# Patient Record
Sex: Female | Born: 1940 | Race: White | Hispanic: No | Marital: Married | Smoking: Never smoker
Health system: Southern US, Community
[De-identification: ages and names within clinical notes are randomized; demographics above are authoritative.]

## PROBLEM LIST (undated history)

## (undated) DIAGNOSIS — Z9889 Other specified postprocedural states: Secondary | ICD-10-CM

## (undated) DIAGNOSIS — E119 Type 2 diabetes mellitus without complications: Secondary | ICD-10-CM

## (undated) DIAGNOSIS — D649 Anemia, unspecified: Secondary | ICD-10-CM

## (undated) DIAGNOSIS — I10 Essential (primary) hypertension: Secondary | ICD-10-CM

## (undated) DIAGNOSIS — K589 Irritable bowel syndrome without diarrhea: Secondary | ICD-10-CM

## (undated) DIAGNOSIS — E785 Hyperlipidemia, unspecified: Secondary | ICD-10-CM

## (undated) DIAGNOSIS — M1990 Unspecified osteoarthritis, unspecified site: Secondary | ICD-10-CM

## (undated) DIAGNOSIS — F32A Depression, unspecified: Secondary | ICD-10-CM

## (undated) DIAGNOSIS — F329 Major depressive disorder, single episode, unspecified: Secondary | ICD-10-CM

## (undated) DIAGNOSIS — K279 Peptic ulcer, site unspecified, unspecified as acute or chronic, without hemorrhage or perforation: Secondary | ICD-10-CM

## (undated) HISTORY — PX: COLONOSCOPY W/ POLYPECTOMY: SHX1380

## (undated) HISTORY — PX: BREAST BIOPSY: SHX20

## (undated) HISTORY — PX: BREAST CYST ASPIRATION: SHX578

---

## 2004-08-15 ENCOUNTER — Ambulatory Visit: Payer: Self-pay | Admitting: Family Medicine

## 2005-08-14 ENCOUNTER — Ambulatory Visit: Payer: Self-pay | Admitting: Family Medicine

## 2005-10-02 ENCOUNTER — Inpatient Hospital Stay: Payer: Self-pay | Admitting: Surgery

## 2005-10-02 ENCOUNTER — Other Ambulatory Visit: Payer: Self-pay

## 2006-07-30 ENCOUNTER — Ambulatory Visit: Payer: Self-pay | Admitting: Rheumatology

## 2006-08-19 ENCOUNTER — Ambulatory Visit: Payer: Self-pay

## 2007-08-25 ENCOUNTER — Ambulatory Visit: Payer: Self-pay

## 2008-08-31 ENCOUNTER — Ambulatory Visit: Payer: Self-pay

## 2009-09-01 ENCOUNTER — Ambulatory Visit: Payer: Self-pay | Admitting: Internal Medicine

## 2010-08-28 ENCOUNTER — Ambulatory Visit: Payer: Self-pay | Admitting: Unknown Physician Specialty

## 2010-09-03 ENCOUNTER — Ambulatory Visit: Payer: Self-pay | Admitting: Internal Medicine

## 2011-09-26 ENCOUNTER — Ambulatory Visit: Payer: Self-pay | Admitting: Internal Medicine

## 2011-10-31 ENCOUNTER — Ambulatory Visit: Payer: Self-pay | Admitting: Unknown Physician Specialty

## 2011-11-04 LAB — PATHOLOGY REPORT

## 2012-09-30 ENCOUNTER — Ambulatory Visit: Payer: Self-pay | Admitting: Internal Medicine

## 2012-12-03 ENCOUNTER — Ambulatory Visit: Payer: Self-pay | Admitting: Unknown Physician Specialty

## 2013-01-21 ENCOUNTER — Ambulatory Visit: Payer: Self-pay | Admitting: Cardiology

## 2013-02-05 ENCOUNTER — Ambulatory Visit: Payer: Self-pay | Admitting: Unknown Physician Specialty

## 2013-04-12 ENCOUNTER — Ambulatory Visit: Payer: Self-pay | Admitting: Unknown Physician Specialty

## 2013-07-20 ENCOUNTER — Ambulatory Visit: Payer: Self-pay | Admitting: Unknown Physician Specialty

## 2013-10-04 ENCOUNTER — Ambulatory Visit: Payer: Self-pay | Admitting: Internal Medicine

## 2014-10-06 ENCOUNTER — Ambulatory Visit: Payer: Self-pay | Admitting: Internal Medicine

## 2015-08-17 ENCOUNTER — Other Ambulatory Visit: Payer: Self-pay | Admitting: Internal Medicine

## 2015-08-17 DIAGNOSIS — Z1231 Encounter for screening mammogram for malignant neoplasm of breast: Secondary | ICD-10-CM

## 2015-10-10 ENCOUNTER — Other Ambulatory Visit: Payer: Self-pay | Admitting: Internal Medicine

## 2015-10-10 ENCOUNTER — Ambulatory Visit
Admission: RE | Admit: 2015-10-10 | Discharge: 2015-10-10 | Disposition: A | Discharge status: Home / Self Care | Payer: Medicare Other | Source: Ambulatory Visit | Attending: Internal Medicine | Admitting: Internal Medicine

## 2015-10-10 DIAGNOSIS — Z1231 Encounter for screening mammogram for malignant neoplasm of breast: Secondary | ICD-10-CM | POA: Insufficient documentation

## 2016-08-06 ENCOUNTER — Other Ambulatory Visit: Payer: Self-pay | Admitting: Internal Medicine

## 2016-08-06 DIAGNOSIS — Z1231 Encounter for screening mammogram for malignant neoplasm of breast: Secondary | ICD-10-CM

## 2016-10-10 ENCOUNTER — Ambulatory Visit
Admission: RE | Admit: 2016-10-10 | Discharge: 2016-10-10 | Disposition: A | Discharge status: Home / Self Care | Payer: Medicare Other | Source: Ambulatory Visit | Attending: Internal Medicine | Admitting: Internal Medicine

## 2016-10-10 DIAGNOSIS — Z1231 Encounter for screening mammogram for malignant neoplasm of breast: Secondary | ICD-10-CM | POA: Insufficient documentation

## 2016-12-31 ENCOUNTER — Encounter: Payer: Self-pay | Admitting: *Deleted

## 2017-01-01 ENCOUNTER — Ambulatory Visit: Payer: Medicare Other | Admitting: Anesthesiology

## 2017-01-01 ENCOUNTER — Ambulatory Visit
Admission: RE | Admit: 2017-01-01 | Discharge: 2017-01-01 | Disposition: A | Discharge status: Home / Self Care | Payer: Medicare Other | Source: Ambulatory Visit | Attending: Unknown Physician Specialty | Admitting: Unknown Physician Specialty

## 2017-01-01 ENCOUNTER — Encounter
Admission: RE | Disposition: A | Discharge status: Home / Self Care | Payer: Self-pay | Source: Ambulatory Visit | Attending: Unknown Physician Specialty

## 2017-01-01 DIAGNOSIS — I10 Essential (primary) hypertension: Secondary | ICD-10-CM | POA: Insufficient documentation

## 2017-01-01 DIAGNOSIS — Z1211 Encounter for screening for malignant neoplasm of colon: Secondary | ICD-10-CM | POA: Diagnosis present

## 2017-01-01 DIAGNOSIS — D120 Benign neoplasm of cecum: Secondary | ICD-10-CM | POA: Insufficient documentation

## 2017-01-01 DIAGNOSIS — D123 Benign neoplasm of transverse colon: Secondary | ICD-10-CM | POA: Diagnosis not present

## 2017-01-01 DIAGNOSIS — Z803 Family history of malignant neoplasm of breast: Secondary | ICD-10-CM | POA: Insufficient documentation

## 2017-01-01 DIAGNOSIS — Z7982 Long term (current) use of aspirin: Secondary | ICD-10-CM | POA: Diagnosis not present

## 2017-01-01 DIAGNOSIS — M1990 Unspecified osteoarthritis, unspecified site: Secondary | ICD-10-CM | POA: Diagnosis not present

## 2017-01-01 DIAGNOSIS — D122 Benign neoplasm of ascending colon: Secondary | ICD-10-CM | POA: Diagnosis not present

## 2017-01-01 DIAGNOSIS — Z8711 Personal history of peptic ulcer disease: Secondary | ICD-10-CM | POA: Diagnosis not present

## 2017-01-01 DIAGNOSIS — K222 Esophageal obstruction: Secondary | ICD-10-CM | POA: Insufficient documentation

## 2017-01-01 DIAGNOSIS — K589 Irritable bowel syndrome without diarrhea: Secondary | ICD-10-CM | POA: Insufficient documentation

## 2017-01-01 DIAGNOSIS — E119 Type 2 diabetes mellitus without complications: Secondary | ICD-10-CM | POA: Insufficient documentation

## 2017-01-01 DIAGNOSIS — E785 Hyperlipidemia, unspecified: Secondary | ICD-10-CM | POA: Diagnosis not present

## 2017-01-01 DIAGNOSIS — Z79899 Other long term (current) drug therapy: Secondary | ICD-10-CM | POA: Insufficient documentation

## 2017-01-01 DIAGNOSIS — Z880 Allergy status to penicillin: Secondary | ICD-10-CM | POA: Insufficient documentation

## 2017-01-01 DIAGNOSIS — F329 Major depressive disorder, single episode, unspecified: Secondary | ICD-10-CM | POA: Diagnosis not present

## 2017-01-01 DIAGNOSIS — Z888 Allergy status to other drugs, medicaments and biological substances status: Secondary | ICD-10-CM | POA: Insufficient documentation

## 2017-01-01 DIAGNOSIS — K640 First degree hemorrhoids: Secondary | ICD-10-CM | POA: Diagnosis not present

## 2017-01-01 DIAGNOSIS — Z8371 Family history of colonic polyps: Secondary | ICD-10-CM | POA: Diagnosis not present

## 2017-01-01 HISTORY — DX: Peptic ulcer, site unspecified, unspecified as acute or chronic, without hemorrhage or perforation: K27.9

## 2017-01-01 HISTORY — DX: Other specified postprocedural states: Z98.890

## 2017-01-01 HISTORY — DX: Type 2 diabetes mellitus without complications: E11.9

## 2017-01-01 HISTORY — DX: Major depressive disorder, single episode, unspecified: F32.9

## 2017-01-01 HISTORY — DX: Unspecified osteoarthritis, unspecified site: M19.90

## 2017-01-01 HISTORY — DX: Irritable bowel syndrome, unspecified: K58.9

## 2017-01-01 HISTORY — DX: Depression, unspecified: F32.A

## 2017-01-01 HISTORY — DX: Essential (primary) hypertension: I10

## 2017-01-01 HISTORY — PX: COLONOSCOPY WITH PROPOFOL: SHX5780

## 2017-01-01 HISTORY — PX: ESOPHAGOGASTRODUODENOSCOPY (EGD) WITH PROPOFOL: SHX5813

## 2017-01-01 HISTORY — DX: Hyperlipidemia, unspecified: E78.5

## 2017-01-01 HISTORY — DX: Anemia, unspecified: D64.9

## 2017-01-01 SURGERY — COLONOSCOPY WITH PROPOFOL
Anesthesia: General

## 2017-01-01 MED ORDER — GLYCOPYRROLATE 0.2 MG/ML IJ SOLN
INTRAMUSCULAR | Status: DC | PRN
  Administered 2017-01-01: 0.2 mg via INTRAVENOUS

## 2017-01-01 MED ORDER — EPHEDRINE SULFATE 50 MG/ML IJ SOLN
INTRAMUSCULAR | Status: DC | PRN
  Administered 2017-01-01: 15 mg via INTRAVENOUS
  Administered 2017-01-01: 10 mg via INTRAVENOUS

## 2017-01-01 MED ORDER — PROPOFOL 10 MG/ML IV BOLUS
INTRAVENOUS | Status: AC
  Filled 2017-01-01: qty 20

## 2017-01-01 MED ORDER — MIDAZOLAM HCL 2 MG/2ML IJ SOLN
INTRAMUSCULAR | Status: AC
  Filled 2017-01-01: qty 2

## 2017-01-01 MED ORDER — LIDOCAINE HCL (PF) 2 % IJ SOLN
INTRAMUSCULAR | Status: DC | PRN
  Administered 2017-01-01: 50 mg

## 2017-01-01 MED ORDER — MIDAZOLAM HCL 5 MG/5ML IJ SOLN
INTRAMUSCULAR | Status: DC | PRN
  Administered 2017-01-01: 2 mg via INTRAVENOUS

## 2017-01-01 MED ORDER — GLYCOPYRROLATE 0.2 MG/ML IJ SOLN
INTRAMUSCULAR | Status: AC
  Filled 2017-01-01: qty 1

## 2017-01-01 MED ORDER — SODIUM CHLORIDE 0.9 % IV SOLN
INTRAVENOUS | Status: DC
  Administered 2017-01-01: 1000 mL via INTRAVENOUS

## 2017-01-01 MED ORDER — FENTANYL CITRATE (PF) 100 MCG/2ML IJ SOLN
INTRAMUSCULAR | Status: AC
  Filled 2017-01-01: qty 2

## 2017-01-01 MED ORDER — SODIUM CHLORIDE 0.9 % IV SOLN: INTRAVENOUS | Status: DC

## 2017-01-01 MED ORDER — LIDOCAINE HCL (PF) 2 % IJ SOLN
INTRAMUSCULAR | Status: AC
Start: 2017-01-01 — End: 2017-01-01
  Filled 2017-01-01: qty 2

## 2017-01-01 MED ORDER — PROPOFOL 10 MG/ML IV BOLUS
INTRAVENOUS | Status: DC | PRN
  Administered 2017-01-01: 20 mg via INTRAVENOUS
  Administered 2017-01-01: 30 mg via INTRAVENOUS

## 2017-01-01 MED ORDER — FENTANYL CITRATE (PF) 100 MCG/2ML IJ SOLN
INTRAMUSCULAR | Status: DC | PRN
  Administered 2017-01-01: 50 ug via INTRAVENOUS
  Administered 2017-01-01 (×2): 25 ug via INTRAVENOUS

## 2017-01-01 MED ORDER — PROPOFOL 500 MG/50ML IV EMUL
INTRAVENOUS | Status: DC | PRN
  Administered 2017-01-01: 50 ug/kg/min via INTRAVENOUS

## 2017-01-01 NOTE — Anesthesia Preprocedure Evaluation (Signed)
Anesthesia Evaluation  Patient identified by MRN, date of birth, ID band Patient awake    Reviewed: Allergy & Precautions, NPO status , Patient's Chart, lab work & pertinent test results  Airway Mallampati: II       Dental  (+) Teeth Intact   Pulmonary neg pulmonary ROS,    Pulmonary exam normal        Cardiovascular Exercise Tolerance: Good hypertension, Pt. on home beta blockers  Rhythm:Regular Rate:Normal     Neuro/Psych Depression negative neurological ROS     GI/Hepatic Neg liver ROS, PUD,   Endo/Other  diabetes, Type 2  Renal/GU      Musculoskeletal   Abdominal Normal abdominal exam  (+)   Peds negative pediatric ROS (+)  Hematology  (+) anemia ,   Anesthesia Other Findings   Reproductive/Obstetrics                             Anesthesia Physical Anesthesia Plan  ASA: II  Anesthesia Plan: General   Post-op Pain Management:    Induction: Intravenous  PONV Risk Score and Plan: 0  Airway Management Planned: Natural Airway and Nasal Cannula  Additional Equipment:   Intra-op Plan:   Post-operative Plan:   Informed Consent: I have reviewed the patients History and Physical, chart, labs and discussed the procedure including the risks, benefits and alternatives for the proposed anesthesia with the patient or authorized representative who has indicated his/her understanding and acceptance.     Plan Discussed with: CRNA  Anesthesia Plan Comments:         Anesthesia Quick Evaluation

## 2017-01-01 NOTE — Op Note (Signed)
[facility] Gastroenterology Patient Name: Ashley Buchanan Procedure Date: 01/01/2017 11:03 AM MRN: [id number] Account #: [id number] Date of Birth: Apr 25, 1941 Admit Type: Outpatient Age: 76 Room: [facility] ENDO ROOM 3 Gender: Female Note Status: Finalized Procedure:            Upper GI endoscopy Indications:          Dysphagia Providers:            Manya Silvas, MD Referring MD:         Ramonita Lab, MD (Referring MD) Medicines:            Propofol per Anesthesia Complications:        No immediate complications. Procedure:            Pre-Anesthesia Assessment:                       - After reviewing the risks and benefits, the patient                        was deemed in satisfactory condition to undergo the                        procedure.                       After obtaining informed consent, the endoscope was                        passed under direct vision. Throughout the procedure,                        the patient's blood pressure, pulse, and oxygen                        saturations were monitored continuously. The Endoscope                        was introduced through the mouth, and advanced to the                        second part of duodenum. The upper GI endoscopy was                        accomplished without difficulty. The patient tolerated                        the procedure well. Findings:      A mild Schatzki ring (acquired) was found at the gastroesophageal       junction. After exam done A guidewire was placed and the scope was       withdrawn. Dilation was performed with a Savary dilator with mild       resistance at 16 mm.      The stomach was normal.      The examined duodenum was normal. Impression:           - Mild Schatzki ring. Dilated.                       - Normal stomach.                       -  Normal examined duodenum.                       - No specimens collected. Recommendation:       - Perform a colonoscopy as previously  scheduled.                       - soft food for 3 days, eat slowly, chew well, take                        small bites Manya Silvas, MD 01/01/2017 11:35:55 AM This report has been signed electronically. Number of Addenda: 0 Note Initiated On: 01/01/2017 11:03 AM      [facility]

## 2017-01-01 NOTE — Anesthesia Postprocedure Evaluation (Signed)
Anesthesia Post Note  Patient: Lowanda Foster  Procedure(s) Performed: Procedure(s) (LRB): COLONOSCOPY WITH PROPOFOL (N/A) ESOPHAGOGASTRODUODENOSCOPY (EGD) WITH PROPOFOL (N/A)  Patient location during evaluation: PACU Anesthesia Type: General Level of consciousness: awake Pain management: pain level controlled Vital Signs Assessment: post-procedure vital signs reviewed and stable Respiratory status: spontaneous breathing Cardiovascular status: stable Anesthetic complications: no     Last Vitals:  Vitals:   01/01/17 1220 01/01/17 1230  BP: 131/61 (!) 141/69  Pulse: 68 68  Resp: 20 12  Temp:      Last Pain:  Vitals:   01/01/17 1011  TempSrc: Tympanic                 VAN STAVEREN,Jizelle Conkey

## 2017-01-01 NOTE — H&P (Signed)
Primary Care Physician:  Adin Hector, MD Primary Gastroenterologist:  Dr. Vira Agar  Pre-Procedure History & Physical: HPI:  Ashley Buchanan is a 76 y.o. female is here for an endoscopy and colonoscopy.   Past Medical History:  Diagnosis Date  . Anemia   . Arthritis    osteoarthritis  . Depression   . Diabetes mellitus without complication ([place])   . History of esophagogastroduodenoscopy (EGD)   . Hyperlipidemia   . Hypertension   . IBS (irritable bowel syndrome)   . Peptic ulcer disease     Past Surgical History:  Procedure Laterality Date  . BREAST BIOPSY Left    Stereo  . BREAST CYST ASPIRATION Right   . COLONOSCOPY W/ POLYPECTOMY      Prior to Admission medications   Medication Sig Start Date End Date Taking? Authorizing Provider  aspirin EC 81 MG tablet Take 81 mg by mouth daily.   Yes [provider]  atenolol (TENORMIN) 25 MG tablet Take by mouth daily.   Yes [provider]  atorvastatin (LIPITOR) 40 MG tablet Take 40 mg by mouth daily.   Yes [provider]  Calcium Carb-Cholecalciferol (CALCIUM CARBONATE-VITAMIN D3 PO) Take by mouth 2 (two) times daily.   Yes [provider]  escitalopram (LEXAPRO) 10 MG tablet Take 10 mg by mouth daily.   Yes [provider]  gabapentin (NEURONTIN) 300 MG capsule Take 300 mg by mouth 3 (three) times daily.   Yes [provider]  loratadine (CLARITIN) 10 MG tablet Take 10 mg by mouth daily.   Yes [provider]  losartan (COZAAR) 50 MG tablet Take 50 mg by mouth daily.   Yes [provider]  mometasone (NASONEX) 50 MCG/ACT nasal spray Place 1 spray into the nose daily.   Yes [provider]  NABUMETONE PO Take 750 mg by mouth 2 (two) times daily.   Yes [provider]  OMEGA-3 FATTY ACIDS PO Take 360 mg by mouth daily.   Yes [provider]  omeprazole (PRILOSEC) 40 MG capsule Take 40 mg by mouth daily.   Yes [provider]  zolpidem (AMBIEN) 5 MG tablet Take 5 mg by mouth at bedtime as needed for sleep.   Yes [provider]    Allergies as of 10/22/2016  . (Not on File)    Family History  Problem Relation Age of Onset  . Breast cancer Cousin     Social History   Social History  . Marital status: Married    Spouse name: N/A  . Number of children: N/A  . Years of education: N/A   Occupational History  . Not on file.   Social History Main Topics  . Smoking status: Never Smoker  . Smokeless tobacco: Never Used  . Alcohol use No  . Drug use: No  . Sexual activity: Not on file   Other Topics Concern  . Not on file   Social History Narrative  . No narrative on file    Review of Systems: See HPI, otherwise negative ROS  Physical Exam: BP 120/60   Pulse 63   Temp (!) 96.5 F (35.8 C) (Tympanic)   Resp 16   Ht 5\' 3"  (1.6 m)   Wt 60.3 kg (133 lb)   SpO2 100%   BMI 23.56 kg/m  General:   Alert,  pleasant and cooperative in NAD Head:  Normocephalic and atraumatic. Neck:  Supple; no masses or thyromegaly. Lungs:  Clear throughout  to auscultation.    Heart:  Regular rate and rhythm. Abdomen:  Soft, nontender and nondistended. Normal bowel sounds, without guarding, and without rebound.   Neurologic:  Alert and  oriented x4;  grossly normal neurologically.  Impression/Plan: Ashley Buchanan is here for an endoscopy and colonoscopy to be performed for dysphagia and FH colon polyps.  Risks, benefits, limitations, and alternatives regarding  endoscopy and colonoscopy have been reviewed with the patient.  Questions have been answered.  All parties agreeable.   Gaylyn Cheers, MD  01/01/2017, 11:20 AM

## 2017-01-01 NOTE — Op Note (Signed)
[facility] Gastroenterology Patient Name: Ashley Buchanan Procedure Date: 01/01/2017 11:03 AM MRN: [id number] Account #: [id number] Date of Birth: 02-22-1941 Admit Type: Outpatient Age: 76 Room: [facility] ENDO ROOM 3 Gender: Female Note Status: Finalized Procedure:            Colonoscopy Indications:          Colon cancer screening in patient at increased risk:                        Family history of 1st-degree relative with colon polyps Providers:            Manya Silvas, MD Medicines:            Propofol per Anesthesia Complications:        No immediate complications. Procedure:            Pre-Anesthesia Assessment:                       - After reviewing the risks and benefits, the patient                        was deemed in satisfactory condition to undergo the                        procedure.                       After obtaining informed consent, the colonoscope was                        passed under direct vision. Throughout the procedure,                        the patient's blood pressure, pulse, and oxygen                        saturations were monitored continuously. The                        Colonoscope was introduced through the anus and                        advanced to the the cecum, identified by appendiceal                        orifice and ileocecal valve. The colonoscopy was                        performed without difficulty. The patient tolerated the                        procedure well. The quality of the bowel preparation                        was excellent. Findings:      Three sessile polyps were found in the hepatic flexure, ascending colon       and cecum. The polyps were diminutive in size. These polyps were removed       with a jumbo cold forceps. Resection and retrieval were complete.      The exam was otherwise without abnormality.  Internal hemorrhoids were found during endoscopy. The hemorrhoids were       small and  Grade I (internal hemorrhoids that do not prolapse). Impression:           - Three diminutive polyps at the hepatic flexure, in                        the ascending colon and in the cecum, removed with a                        jumbo cold forceps. Resected and retrieved.                       - The examination was otherwise normal. Recommendation:       - Await pathology results. Manya Silvas, MD 01/01/2017 11:58:17 AM This report has been signed electronically. Number of Addenda: 0 Note Initiated On: 01/01/2017 11:03 AM Scope Withdrawal Time: 0 hours 12 minutes 1 second  Total Procedure Duration: 0 hours 16 minutes 44 seconds       [facility]

## 2017-01-01 NOTE — Anesthesia Post-op Follow-up Note (Cosign Needed)
Anesthesia QCDR form completed.        

## 2017-01-01 NOTE — Transfer of Care (Signed)
Immediate Anesthesia Transfer of Care Note  Patient: Ashley Buchanan  Procedure(s) Performed: Procedure(s): COLONOSCOPY WITH PROPOFOL (N/A) ESOPHAGOGASTRODUODENOSCOPY (EGD) WITH PROPOFOL (N/A)  Patient Location: PACU  Anesthesia Type:General  Level of Consciousness: sedated  Airway & Oxygen Therapy: Patient Spontanous Breathing and Patient connected to nasal cannula oxygen  Post-op Assessment: Report given to RN and Post -op Vital signs reviewed and stable  Post vital signs: Reviewed and stable  Last Vitals:  Vitals:   01/01/17 1011  BP: 120/60  Pulse: 63  Resp: 16  Temp: (!) 35.8 C    Last Pain:  Vitals:   01/01/17 1011  TempSrc: Tympanic         Complications: No apparent anesthesia complications

## 2017-01-02 ENCOUNTER — Encounter: Payer: Self-pay | Admitting: Unknown Physician Specialty

## 2017-01-03 LAB — SURGICAL PATHOLOGY

## 2017-07-21 ENCOUNTER — Other Ambulatory Visit: Payer: Self-pay | Admitting: Internal Medicine

## 2017-07-21 DIAGNOSIS — Z1231 Encounter for screening mammogram for malignant neoplasm of breast: Secondary | ICD-10-CM

## 2017-10-15 ENCOUNTER — Ambulatory Visit
Admission: RE | Admit: 2017-10-15 | Discharge: 2017-10-15 | Disposition: A | Discharge status: Home / Self Care | Payer: Medicare Other | Source: Ambulatory Visit | Attending: Internal Medicine | Admitting: Internal Medicine

## 2017-10-15 DIAGNOSIS — Z1231 Encounter for screening mammogram for malignant neoplasm of breast: Secondary | ICD-10-CM | POA: Insufficient documentation

## 2018-08-19 ENCOUNTER — Other Ambulatory Visit: Payer: Self-pay | Admitting: Internal Medicine

## 2018-08-19 DIAGNOSIS — Z1231 Encounter for screening mammogram for malignant neoplasm of breast: Secondary | ICD-10-CM

## 2019-01-21 ENCOUNTER — Other Ambulatory Visit: Payer: Self-pay

## 2019-01-21 ENCOUNTER — Ambulatory Visit
Admission: RE | Admit: 2019-01-21 | Discharge: 2019-01-21 | Disposition: A | Discharge status: Home / Self Care | Payer: Medicare Other | Source: Ambulatory Visit | Attending: Internal Medicine | Admitting: Internal Medicine

## 2019-01-21 DIAGNOSIS — Z1231 Encounter for screening mammogram for malignant neoplasm of breast: Secondary | ICD-10-CM | POA: Diagnosis present

## 2020-01-04 IMAGING — MG DIGITAL SCREENING BILATERAL MAMMOGRAM WITH TOMO AND CAD
8 series · 9 of 24 positions shown · non-contrast
Comparison: Previous exam(s).

CLINICAL DATA: Screening.

EXAM:
DIGITAL SCREENING BILATERAL MAMMOGRAM WITH TOMO AND CAD

[R CC synth-2D]
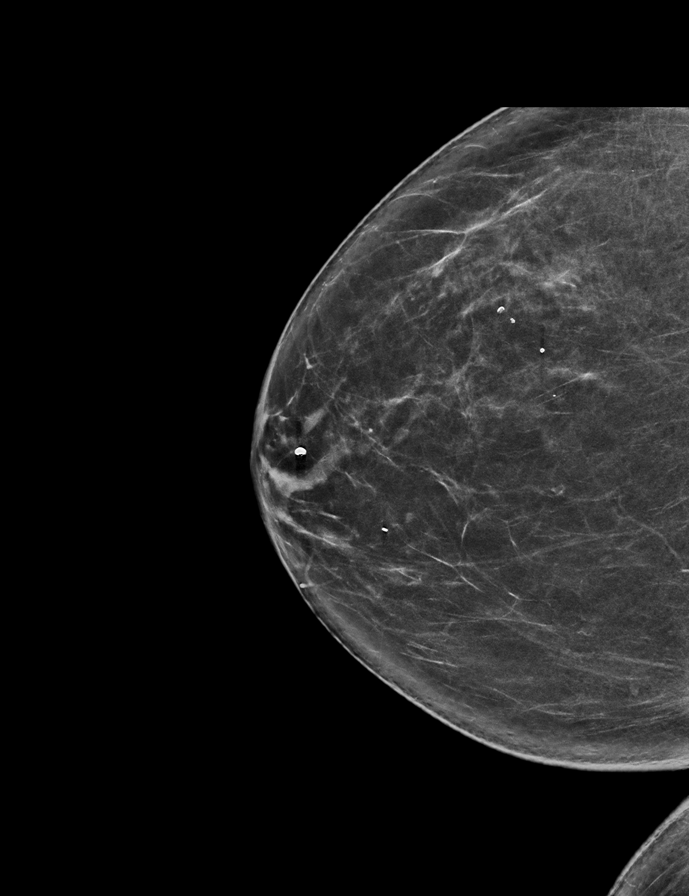

[L CC synth-2D]
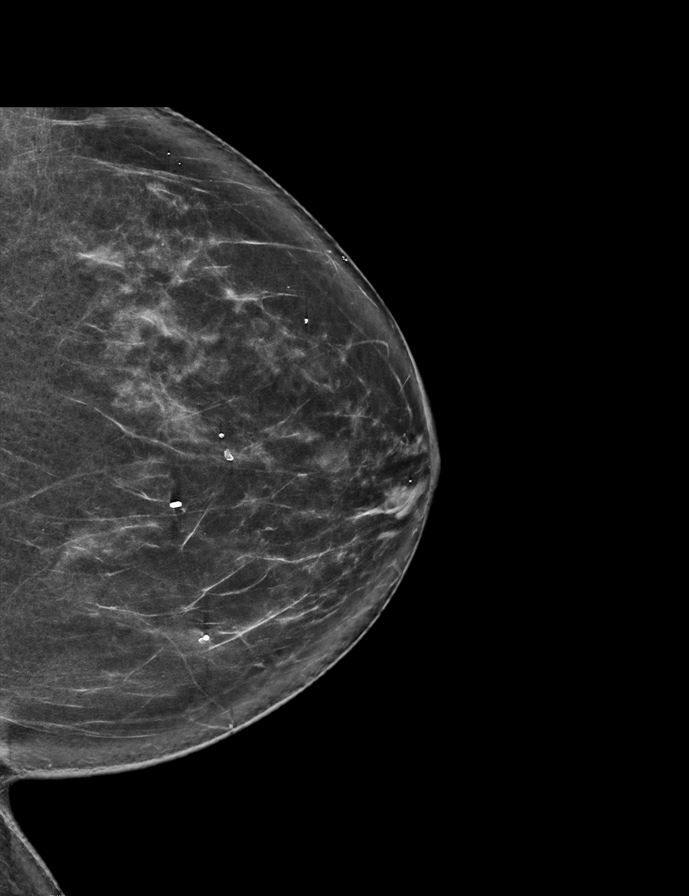

[R MLO synth-2D]
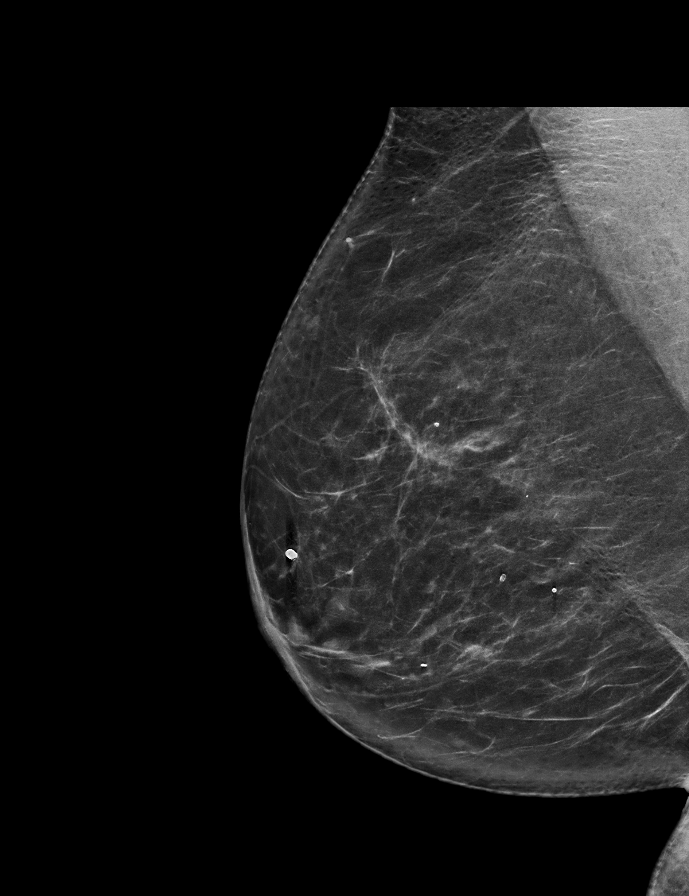

[L MLO synth-2D]
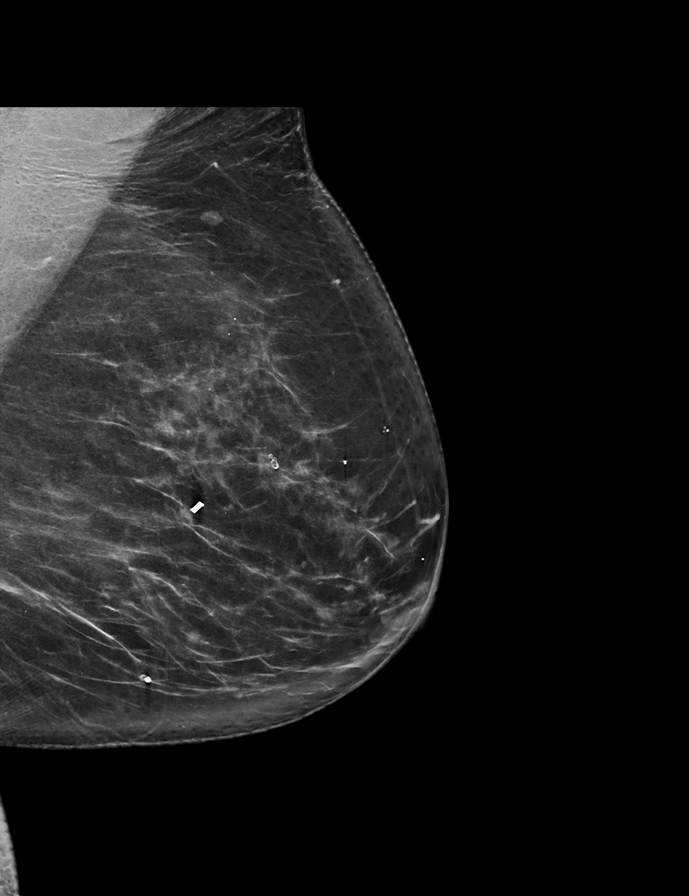

[L CC tomo · 2 of 70 frames shown]
[frame 23/70]
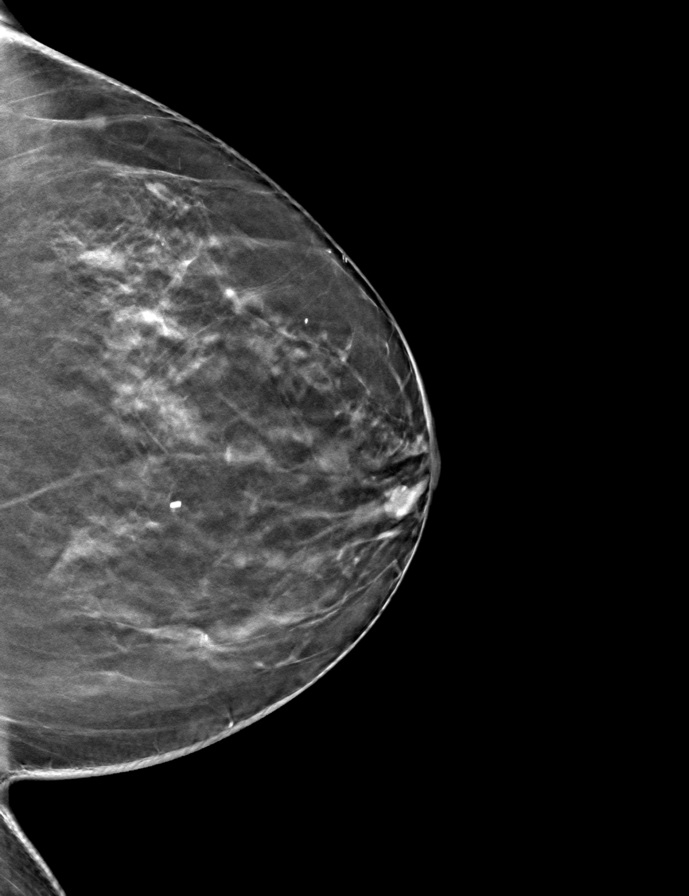
[frame 35/70]
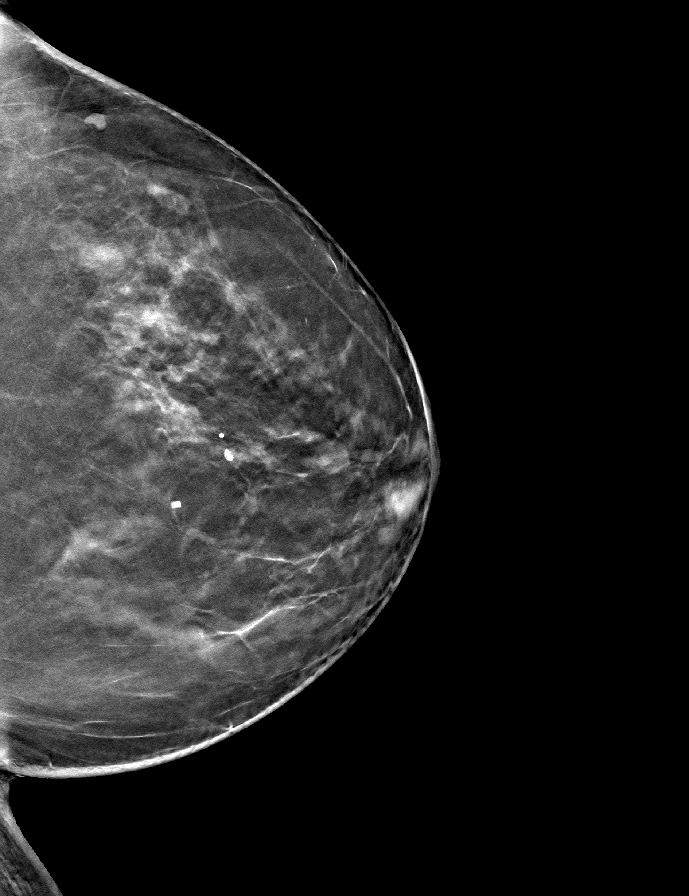

[R MLO tomo · tomo slice 39/78.0]
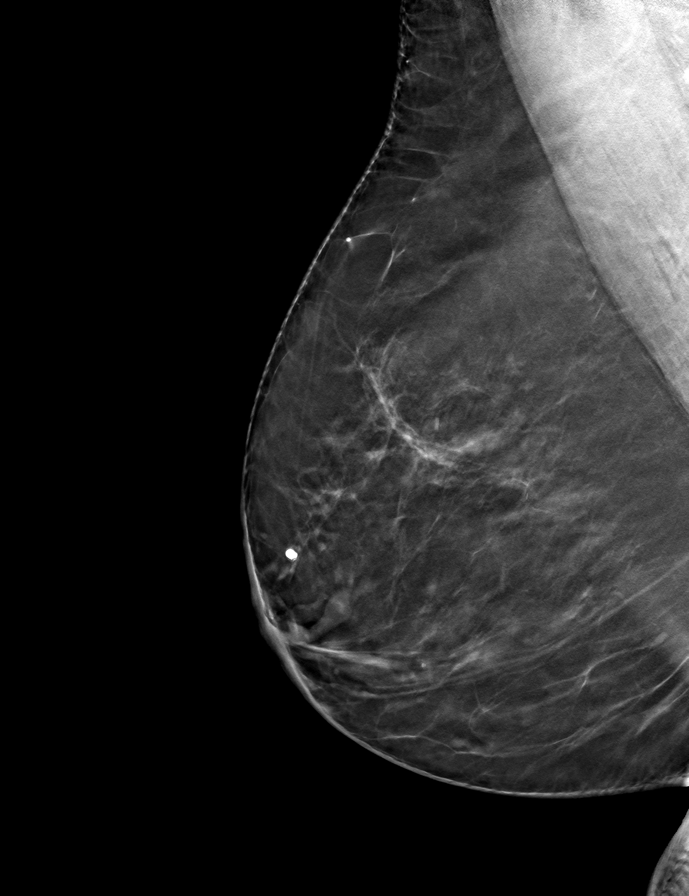

[R CC tomo · tomo slice 35/68.0]
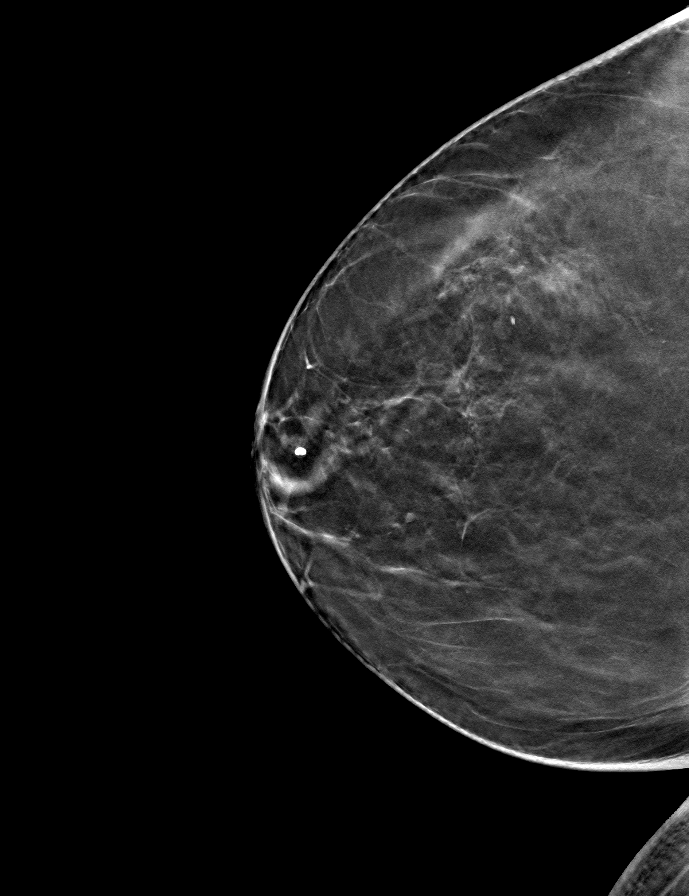

[L MLO tomo · tomo slice 41/82.0]
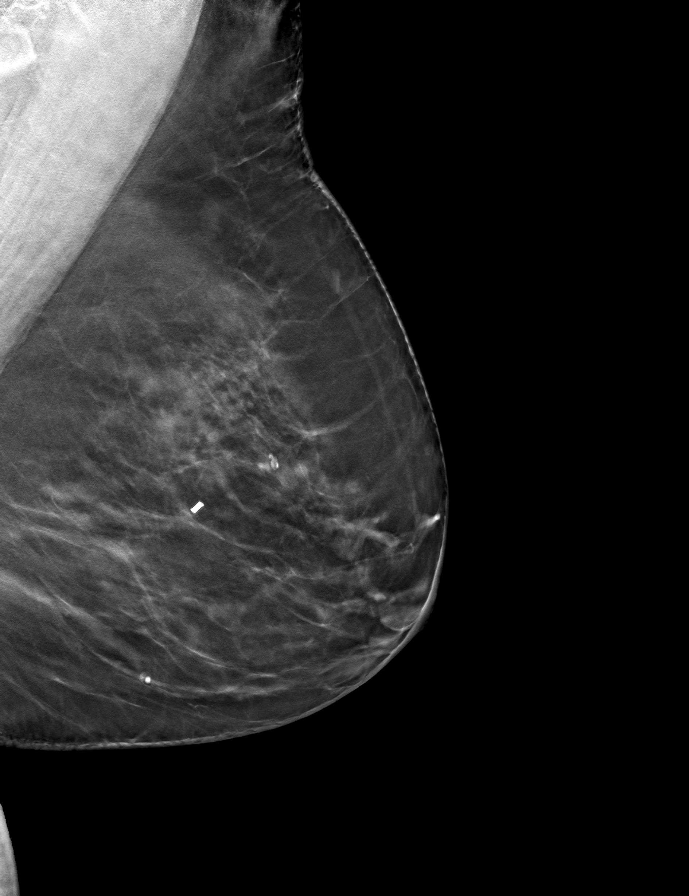

[9 of 24 positions shown; findings below may reference images not displayed]

ACR Breast Density Category b: There are scattered areas of
fibroglandular density.
FINDINGS: There are no findings suspicious for malignancy. Images were
processed with CAD.
IMPRESSION: No mammographic evidence of malignancy. A result letter of this
screening mammogram will be mailed directly to the patient.

RECOMMENDATION:
Screening mammogram in one year. (Code:[id number])

BI-RADS CATEGORY  1: Negative.
